# Patient Record
Sex: Male | Born: 1975 | Race: White | Hispanic: No | Marital: Single | Smoking: Never smoker
Health system: Southern US, Community
[De-identification: ages and names within clinical notes are randomized; demographics above are authoritative.]

---

## 1999-07-22 ENCOUNTER — Emergency Department (HOSPITAL_COMMUNITY): Admission: EM | Admit: 1999-07-22 | Discharge: 1999-07-22 | Payer: Self-pay | Admitting: Emergency Medicine

## 2014-02-10 ENCOUNTER — Emergency Department (HOSPITAL_COMMUNITY)
Admission: EM | Admit: 2014-02-10 | Discharge: 2014-02-10 | Disposition: A | Discharge status: Home / Self Care | Payer: BC Managed Care – PPO | Attending: Emergency Medicine | Admitting: Emergency Medicine

## 2014-02-10 ENCOUNTER — Emergency Department (HOSPITAL_COMMUNITY): Payer: BC Managed Care – PPO

## 2014-02-10 ENCOUNTER — Encounter (HOSPITAL_COMMUNITY): Payer: Self-pay | Admitting: Emergency Medicine

## 2014-02-10 DIAGNOSIS — Z23 Encounter for immunization: Secondary | ICD-10-CM | POA: Diagnosis not present

## 2014-02-10 DIAGNOSIS — Y9389 Activity, other specified: Secondary | ICD-10-CM | POA: Diagnosis not present

## 2014-02-10 DIAGNOSIS — S0993XA Unspecified injury of face, initial encounter: Secondary | ICD-10-CM | POA: Diagnosis present

## 2014-02-10 DIAGNOSIS — Y92410 Unspecified street and highway as the place of occurrence of the external cause: Secondary | ICD-10-CM | POA: Diagnosis not present

## 2014-02-10 DIAGNOSIS — S01311A Laceration without foreign body of right ear, initial encounter: Secondary | ICD-10-CM | POA: Insufficient documentation

## 2014-02-10 DIAGNOSIS — S0101XA Laceration without foreign body of scalp, initial encounter: Secondary | ICD-10-CM | POA: Diagnosis not present

## 2014-02-10 DIAGNOSIS — Y998 Other external cause status: Secondary | ICD-10-CM | POA: Insufficient documentation

## 2014-02-10 MED ORDER — TETANUS-DIPHTH-ACELL PERTUSSIS 5-2.5-18.5 LF-MCG/0.5 IM SUSP
INTRAMUSCULAR | Status: AC
  Filled 2014-02-10: qty 0.5

## 2014-02-10 MED ORDER — TETANUS-DIPHTH-ACELL PERTUSSIS 5-2.5-18.5 LF-MCG/0.5 IM SUSP
0.5000 mL | Freq: Once | INTRAMUSCULAR | Status: AC
  Administered 2014-02-10: 0.5 mL via INTRAMUSCULAR
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
## 2014-02-10 NOTE — ED Notes (Signed)
Steri strips applied to posterior laceration noted behind right ear. Pt tolerated well. Site cleaned with wounds cleanser prior to steri strip application. nad noted.

## 2014-02-10 NOTE — ED Notes (Signed)
Pt's wounds cleaned. Lac to L head behind ear. Bleeding controlled.

## 2014-02-10 NOTE — ED Notes (Signed)
Awaiting CT and Xray reports for discharge.
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
